# Patient Record
Sex: Male | Born: 1980 | Race: White | Hispanic: No | Marital: Married | Smoking: Former smoker
Health system: Southern US, Community
[De-identification: ages and names within clinical notes are randomized; demographics above are authoritative.]

## PROBLEM LIST (undated history)

## (undated) HISTORY — PX: NO PAST SURGERIES: SHX2092

---

## 2005-03-19 ENCOUNTER — Ambulatory Visit: Payer: Self-pay | Admitting: Urology

## 2005-04-02 ENCOUNTER — Other Ambulatory Visit: Payer: Self-pay

## 2005-04-04 ENCOUNTER — Ambulatory Visit: Payer: Self-pay | Admitting: Urology

## 2008-04-06 ENCOUNTER — Emergency Department: Payer: Self-pay | Admitting: Emergency Medicine

## 2011-01-17 ENCOUNTER — Ambulatory Visit: Payer: Self-pay | Admitting: Family Medicine

## 2011-07-11 ENCOUNTER — Ambulatory Visit: Payer: Self-pay | Admitting: Internal Medicine

## 2011-07-12 ENCOUNTER — Ambulatory Visit: Payer: Self-pay | Admitting: Internal Medicine

## 2012-10-14 ENCOUNTER — Emergency Department: Payer: Self-pay | Admitting: Emergency Medicine

## 2012-10-14 LAB — URINALYSIS, COMPLETE
Bilirubin,UR: NEGATIVE
Ketone: NEGATIVE
Nitrite: NEGATIVE
RBC,UR: 1057 /HPF (ref 0–5)
Specific Gravity: 1.016 (ref 1.003–1.030)
Squamous Epithelial: NONE SEEN
WBC UR: 11 /HPF (ref 0–5)

## 2012-10-14 LAB — CBC
HCT: 41.5 % (ref 40.0–52.0)
HGB: 14.2 g/dL (ref 13.0–18.0)
MCV: 86 fL (ref 80–100)
RBC: 4.82 10*6/uL (ref 4.40–5.90)
WBC: 7.7 10*3/uL (ref 3.8–10.6)

## 2012-10-14 LAB — COMPREHENSIVE METABOLIC PANEL
Albumin: 3.9 g/dL (ref 3.4–5.0)
BUN: 12 mg/dL (ref 7–18)
Calcium, Total: 8.7 mg/dL (ref 8.5–10.1)
Chloride: 106 mmol/L (ref 98–107)
Co2: 25 mmol/L (ref 21–32)
Creatinine: 0.73 mg/dL (ref 0.60–1.30)
Glucose: 98 mg/dL (ref 65–99)
SGOT(AST): 12 U/L — ABNORMAL LOW (ref 15–37)
SGPT (ALT): 26 U/L (ref 12–78)

## 2014-05-18 DIAGNOSIS — H6981 Other specified disorders of Eustachian tube, right ear: Secondary | ICD-10-CM | POA: Insufficient documentation

## 2014-07-15 DIAGNOSIS — Z720 Tobacco use: Secondary | ICD-10-CM | POA: Insufficient documentation

## 2017-03-25 ENCOUNTER — Other Ambulatory Visit: Payer: Self-pay | Admitting: Ophthalmology

## 2017-03-25 DIAGNOSIS — H5340 Unspecified visual field defects: Secondary | ICD-10-CM

## 2017-04-03 ENCOUNTER — Encounter: Payer: Self-pay | Admitting: Urology

## 2017-04-03 ENCOUNTER — Ambulatory Visit (INDEPENDENT_AMBULATORY_CARE_PROVIDER_SITE_OTHER): Payer: Managed Care, Other (non HMO) | Admitting: Urology

## 2017-04-03 ENCOUNTER — Ambulatory Visit: Payer: Managed Care, Other (non HMO)

## 2017-04-03 ENCOUNTER — Ambulatory Visit
Admission: RE | Admit: 2017-04-03 | Discharge: 2017-04-03 | Disposition: A | Discharge status: Home / Self Care | Payer: Managed Care, Other (non HMO) | Source: Ambulatory Visit | Attending: Ophthalmology | Admitting: Ophthalmology

## 2017-04-03 VITALS — BP 153/107 | HR 108 | Ht 74.0 in | Wt 284.0 lb

## 2017-04-03 DIAGNOSIS — H748X3 Other specified disorders of middle ear and mastoid, bilateral: Secondary | ICD-10-CM | POA: Diagnosis not present

## 2017-04-03 DIAGNOSIS — H5340 Unspecified visual field defects: Secondary | ICD-10-CM | POA: Insufficient documentation

## 2017-04-03 DIAGNOSIS — N5312 Painful ejaculation: Secondary | ICD-10-CM | POA: Diagnosis not present

## 2017-04-03 DIAGNOSIS — R600 Localized edema: Secondary | ICD-10-CM | POA: Insufficient documentation

## 2017-04-03 MED ORDER — GADOBENATE DIMEGLUMINE 529 MG/ML IV SOLN
20.0000 mL | Freq: Once | INTRAVENOUS | Status: AC | PRN
  Administered 2017-04-03: 20 mL via INTRAVENOUS

## 2017-04-03 MED ORDER — ALFUZOSIN HCL ER 10 MG PO TB24: 10.0000 mg | ORAL_TABLET | Freq: Every day | ORAL | 1 refills | Status: AC

## 2017-04-03 NOTE — Progress Notes (Signed)
04/03/2017 4:11 PM   Benjamin Camacho [birth year] [id number]  Referring provider: Randa Spike, DO [phone] [facility] [address] [facility] [employer] [facility] [place], [place] [address]  Chief Complaint  Patient presents with  . Painful Ejaculation    HPI: Benjamin Camacho is a 36 yo male who presents today in consultation at the request of Dr. Lorenza Chick for evaluation of painful ejaculation.  He presents with a 2-3-week history of a burning sensation with ejaculation.  He and his wife are trying to conceive and he has increased the frequency of his ejaculation currently at 2-3 times per week.  He has occasional mild dysuria after ejaculation.  He also notes occasional pain in the right hemithorax near the mid axillary line which is mild.  He has mild dysuria after ejaculation.  Past urologic history remarkable for a previous history of stone disease although has been several years since his last stone.  He has passed 2 previous stones in also had shockwave lithotripsy.  He denies  gross hematuria or hematospermia.  Denies possible urinary tract symptoms.   PMH: History reviewed. No pertinent past medical history.  Surgical History: Past Surgical History:  Procedure Laterality Date  . NO PAST SURGERIES      Home Medications:  Allergies as of 04/03/2017   No Known Allergies     Medication List       Accurate as of 04/03/17  4:11 PM. Always use your most recent med list.          ibuprofen 200 MG tablet Commonly known as:  ADVIL,MOTRIN Take 200 mg by mouth every 6 (six) hours as needed.   omeprazole 20 MG capsule Commonly known as:  PRILOSEC Take 1 capsule by mouth daily.       Allergies: No Known Allergies  Family History: Family History  Problem Relation Age of Onset  . Other Father        blood in urine    Social History:  reports that he has quit smoking. He does not have any smokeless tobacco history on file. His alcohol and drug histories are not on  file.  ROS: UROLOGY Frequent Urination?: No Hard to postpone urination?: No Burning/pain with urination?: Yes Get up at night to urinate?: Yes Leakage of urine?: No Urine stream starts and stops?: No Trouble starting stream?: No Do you have to strain to urinate?: No Blood in urine?: No Urinary tract infection?: No Sexually transmitted disease?: No Injury to kidneys or bladder?: No Painful intercourse?: Yes Weak stream?: No Erection problems?: No Penile pain?: Yes  Gastrointestinal Nausea?: No Vomiting?: No Indigestion/heartburn?: No Diarrhea?: No Constipation?: No  Constitutional Fever: No Night sweats?: No Weight loss?: No Fatigue?: No  Skin Skin rash/lesions?: No Itching?: No  Eyes Blurred vision?: Yes Double vision?: No  Ears/Nose/Throat Sore throat?: No Sinus problems?: Yes  Hematologic/Lymphatic Swollen glands?: No Easy bruising?: No  Cardiovascular Leg swelling?: No Chest pain?: No  Respiratory Cough?: No Shortness of breath?: No  Endocrine Excessive thirst?: No  Musculoskeletal Back pain?: Yes Joint pain?: No  Neurological Headaches?: Yes Dizziness?: No  Psychologic Depression?: No Anxiety?: No  Physical Exam: BP (!) 153/107 (BP Location: Right Arm, Patient Position: Sitting, Cuff Size: Large)   Pulse (!) 108   Ht 6\' 2"  (1.88 m)   Wt 284 lb (128.8 kg)   BMI 36.46 kg/m   Constitutional:  Alert and oriented, No acute distress. HEENT: [place] AT, moist mucus membranes.  Trachea midline, no masses. Cardiovascular: No clubbing, cyanosis,  or edema. Respiratory: Normal respiratory effort, no increased work of breathing. GI: Abdomen is soft, nontender, nondistended, no abdominal masses GU: No CVA tenderness.  Penis circumcised without lesions, testes descended bilaterally without masses or tenderness, cord/epididymes palpably normal.  Prostate 35 g smooth with moderate tenderness.  No nodules or induration. Skin: No rashes, bruises or  suspicious lesions. Lymph: No cervical or inguinal adenopathy. Neurologic: Grossly intact, no focal deficits, moving all 4 extremities. Psychiatric: Normal mood and affect.  Laboratory Data: Lab Results  Component Value Date   WBC 7.7 10/14/2012   HGB 14.2 10/14/2012   HCT 41.5 10/14/2012   MCV 86 10/14/2012   PLT 269 10/14/2012    Lab Results  Component Value Date   CREATININE 0.73 10/14/2012    Urinalysis Negative dipstick/microscopy  Assessment & Plan:    1.  Painful ejaculation Urinalysis was unremarkable.  His symptoms are most likely secondary to prostatic inflammation.  Will give an alpha-blocker trial and since he is actively attempting contraception will use one with a decreased incidence of loss of ejaculation.  Rx alfuzosin was sent to his pharmacy.  He would like to follow-up in 3 months.  He will cancel the appointment if his symptoms resolve.   Riki AltesScott C Margeret Stachnik, MD  [facility] [address], [address] [place], [place] [address] [phone]

## 2017-04-04 LAB — URINALYSIS, COMPLETE
Bilirubin, UA: NEGATIVE
Glucose, UA: NEGATIVE
Ketones, UA: NEGATIVE
LEUKOCYTES UA: NEGATIVE
Nitrite, UA: NEGATIVE
PH UA: 6 (ref 5.0–7.5)
Protein, UA: NEGATIVE
RBC UA: NEGATIVE
Specific Gravity, UA: 1.02 (ref 1.005–1.030)
Urobilinogen, Ur: 0.2 mg/dL (ref 0.2–1.0)

## 2017-04-04 LAB — MICROSCOPIC EXAMINATION
EPITHELIAL CELLS (NON RENAL): NONE SEEN /HPF (ref 0–10)
RBC, UA: NONE SEEN /hpf (ref 0–?)
WBC, UA: NONE SEEN /hpf (ref 0–?)

## 2017-04-14 DIAGNOSIS — N5312 Painful ejaculation: Secondary | ICD-10-CM | POA: Insufficient documentation

## 2017-07-02 ENCOUNTER — Ambulatory Visit: Payer: Managed Care, Other (non HMO) | Admitting: Urology

## 2019-04-16 ENCOUNTER — Other Ambulatory Visit: Payer: Self-pay | Admitting: *Deleted

## 2019-04-16 DIAGNOSIS — Z20822 Contact with and (suspected) exposure to covid-19: Secondary | ICD-10-CM

## 2019-04-18 LAB — NOVEL CORONAVIRUS, NAA: SARS-CoV-2, NAA: NOT DETECTED

## 2019-10-24 IMAGING — MR MR HEAD WO/W CM
12 series · 48 of 48 positions shown · IV contrast (20mL multihance)
Comparison: None.

CLINICAL DATA: Visual field defect left eye

EXAM:
MRI HEAD WITHOUT AND WITH CONTRAST
TECHNIQUE: Multiplanar, multiecho pulse sequences of the brain and surrounding
structures were obtained without and with intravenous contrast.
CONTRAST:  20mL MULTIHANCE GADOBENATE DIMEGLUMINE 529 MG/ML IV SOLN

[Series 2: T1 · sagittal · 5.0mm · 0.45mm/px · 1 of 27 slices shown (1 of 2)]
[im 1/27]
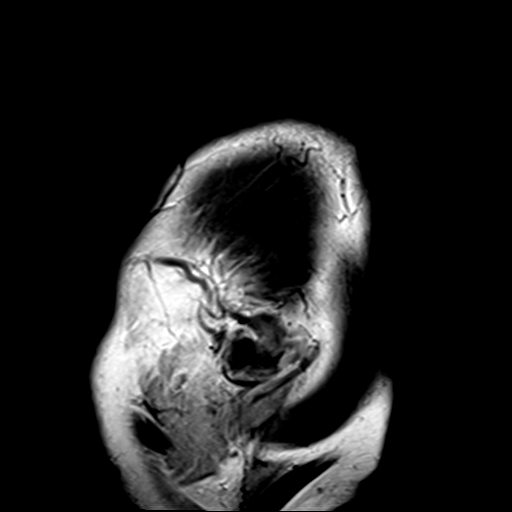

[Series 4: DWI · axial · 3.0mm · 1.80mm/px · z∈[-68,+93]mm · 4 of 55 slices shown (1 of 2)]
[im 1/55]
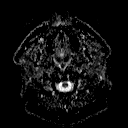
[im 19/55]
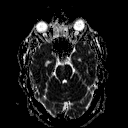
[im 37/55]
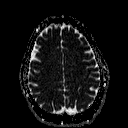
[im 55/55]
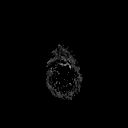

[Series 6: DWI · coronal · 3.0mm · 1.80mm/px · 3 of 48 slices shown (2 of 2)]
[im 1/48]
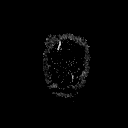
[im 24/48]
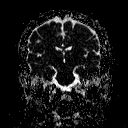
[im 48/48]
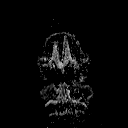

[Series 7: T2 · axial · 5.0mm · 0.60mm/px · z∈[-65,+90]mm · 2 of 25 slices shown (1 of 2)]
[im 1/25]
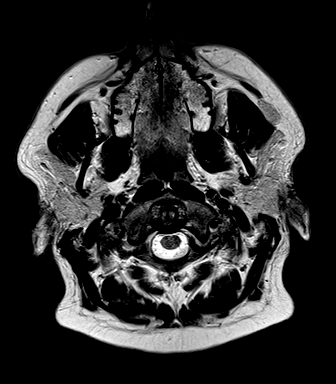
[im 25/25]
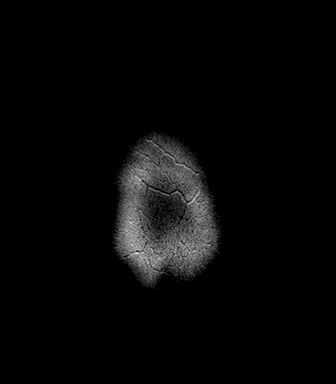

[Series 8: FLAIR · axial · 3.0mm · 0.45mm/px · z∈[-65,+90]mm · 3 of 53 slices shown]
[im 1/53]
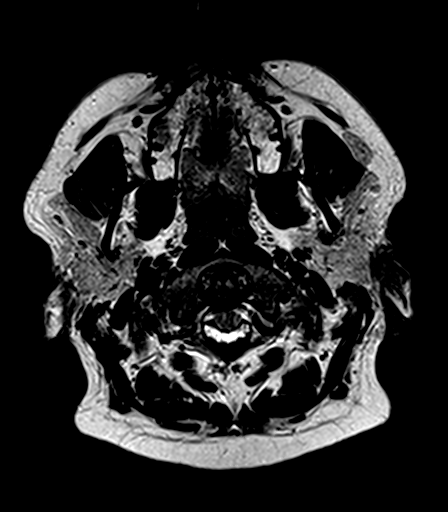
[im 27/53]
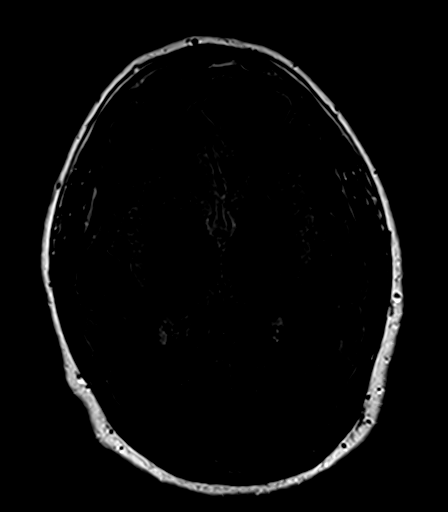
[im 53/53]
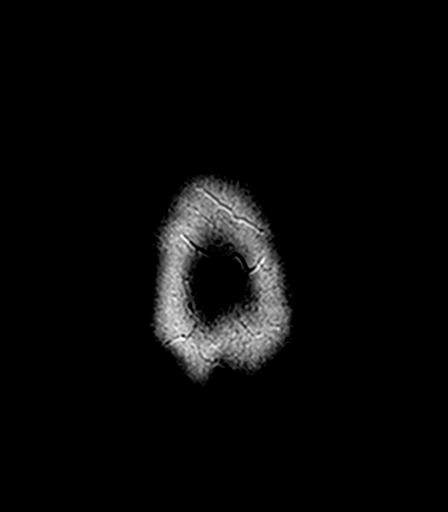

[Series 9: T2 · axial · 5.0mm · 0.45mm/px · z∈[-65,+90]mm · 2 of 25 slices shown (2 of 2)]
[im 1/25]
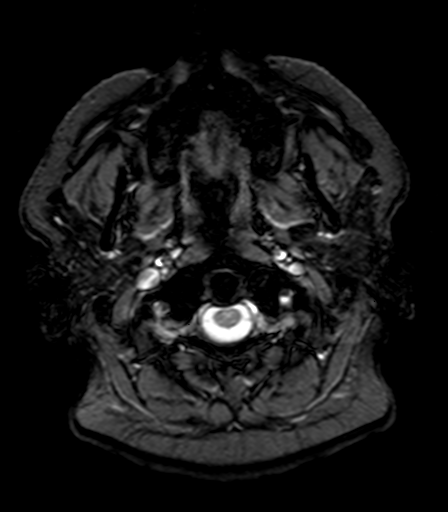
[im 25/25]
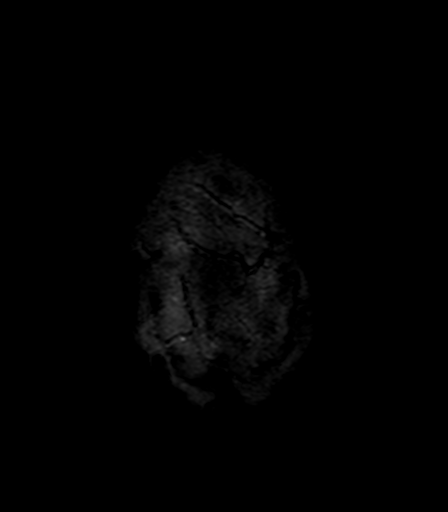

[Series 10: T1 · axial · 1.0mm · 1.00mm/px · z∈[-72,+101]mm · 11 of 176 slices shown (2 of 2)]
[im 1/176]
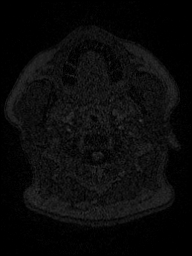
[im 18/176]
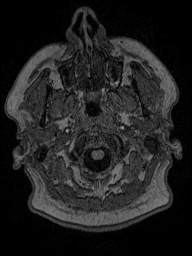
[im 36/176]
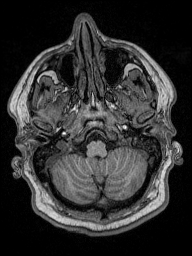
[im 53/176]
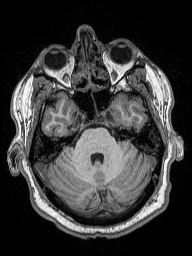
[im 71/176]
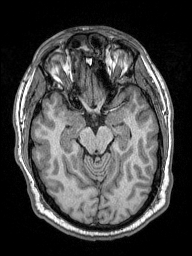
[im 88/176]
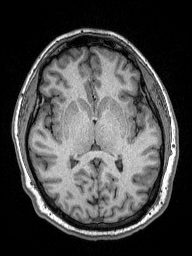
[im 106/176]
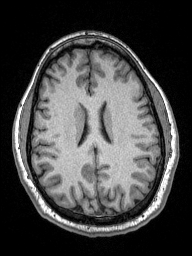
[im 123/176]
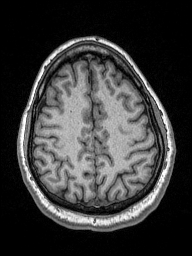
[im 141/176]
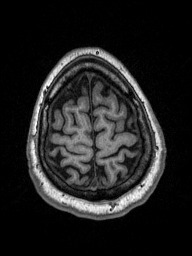
[im 158/176]
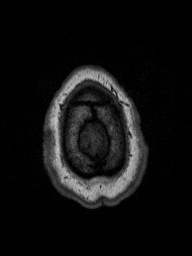
[im 176/176]
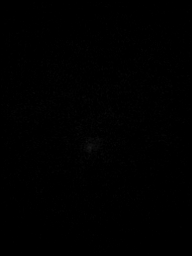

[Series 11: T2 post-contrast · coronal · 5.0mm · 0.49mm/px · 2 of 31 slices shown]
[im 1/31]
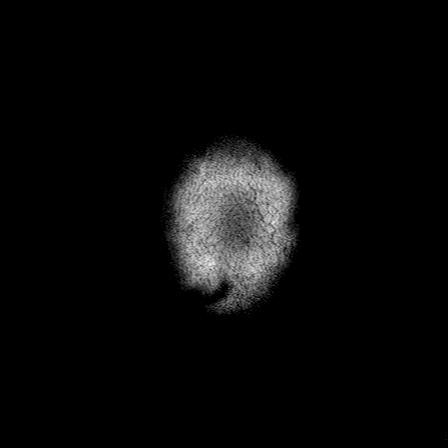
[im 31/31]
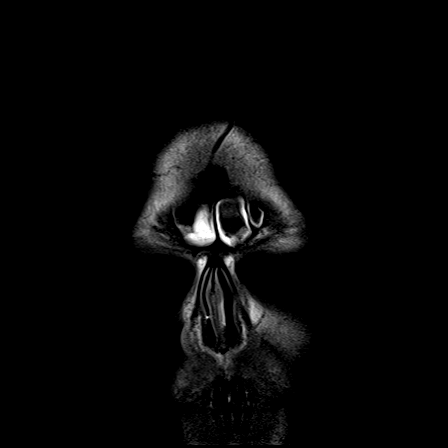

[Series 12: T1 post-contrast · axial · 1.0mm · 1.00mm/px · z∈[-72,+101]mm · 11 of 176 slices shown (1 of 2)]
[im 1/176]
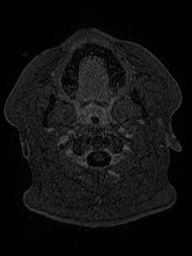
[im 18/176]
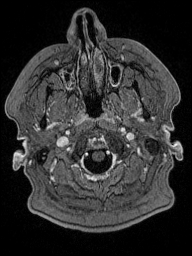
[im 36/176]
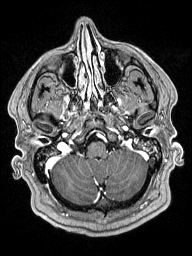
[im 53/176]
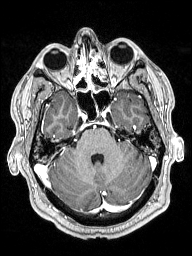
[im 71/176]
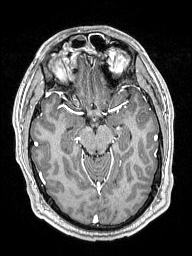
[im 88/176]
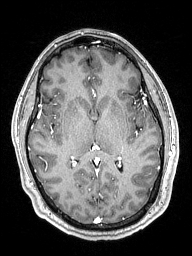
[im 106/176]
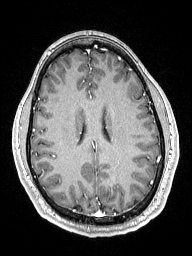
[im 123/176]
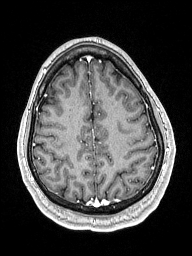
[im 141/176]
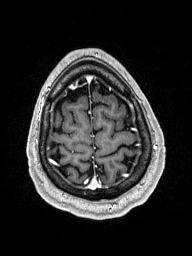
[im 158/176]
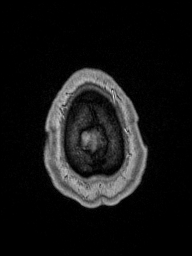
[im 176/176]
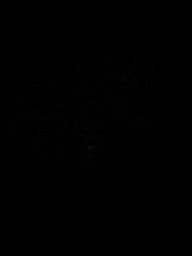

[Series 13: T1 post-contrast · coronal · 5.0mm · 0.43mm/px · 2 of 31 slices shown (2 of 2)]
[im 1/31]
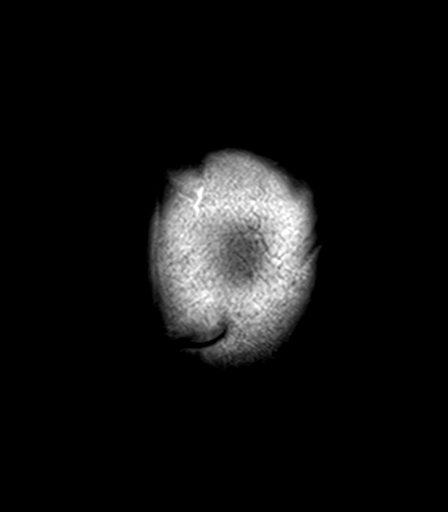
[im 31/31]
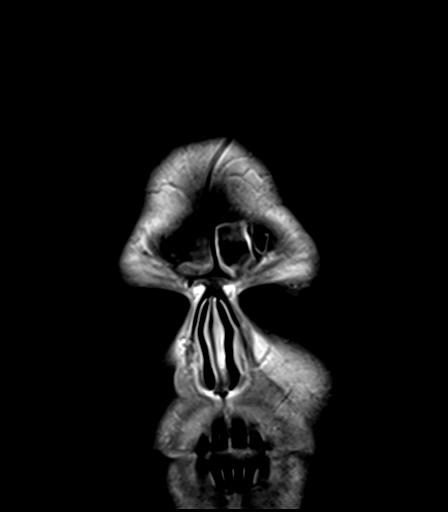

[Series 100: b (id) ax · axial · 3.0mm · 1.80mm/px · z∈[-68,+93]mm · 4 of 55 slices shown]
[im 1/55]
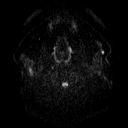
[im 19/55]
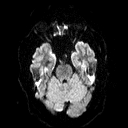
[im 37/55]
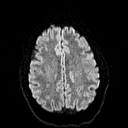
[im 55/55]
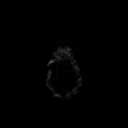

[Series 101: b (id) cor · coronal · 3.0mm · 1.80mm/px · 3 of 48 slices shown]
[im 1/48]
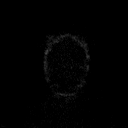
[im 24/48]
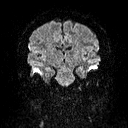
[im 48/48]
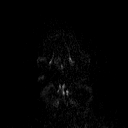

[48 of 48 positions shown; findings below may reference images not displayed]

FINDINGS: Brain: No acute infarction, hemorrhage, hydrocephalus, extra-axial
collection or mass lesion.

Normal enhancement postcontrast administration

Vascular: Normal arterial flow voids.

Skull and upper cervical spine: Negative

Sinuses/Orbits: Moderate mucosal edema throughout the paranasal
sinuses. Bilateral mastoid effusion. Normal orbit bilaterally.

Other: None
IMPRESSION: Normal MRI brain with contrast

PICC extensive mucosal edema throughout the paranasal sinuses with
bilateral mastoid effusion.
